# Patient Record
Sex: Female | Born: 2000 | Race: White | Hispanic: No | Smoking: Never smoker
Health system: Southern US, Community
[De-identification: ages and names within clinical notes are randomized; demographics above are authoritative.]

## PROBLEM LIST (undated history)

## (undated) DIAGNOSIS — H5500 Unspecified nystagmus: Secondary | ICD-10-CM

## (undated) DIAGNOSIS — F909 Attention-deficit hyperactivity disorder, unspecified type: Secondary | ICD-10-CM

## (undated) DIAGNOSIS — J45909 Unspecified asthma, uncomplicated: Secondary | ICD-10-CM

---
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
## 2022-02-01 NOTE — L&D Delivery Note (Signed)
Delivery Note  Courtney Lang is a G1P1001 at [redacted]w[redacted]d, Patient's last menstrual period was 05/26/2021 (exact date)., notconsistent with [place] at [redacted]w[redacted]d. Estimated Date of Delivery: 02/21/22   First Stage: Labor onset: 1500 Augmentation: oxytocin and AROM Analgesia /Anesthesia intrapartum: Epidural AROM at 2221 for meconium stained fluid  GBS: positive IP Antibiotics: penicillin  x 2 doses   Second Stage: Complete dilation at 0247 Onset of pushing at 0255 FHR second stage 140 bpm with moderate, variable decels with pushing   Courtney Lang presented to L&D with painful regular contractions. She was initially expectantly managed.  AROM was performed for augmentation after no cervical change and low dose oxytocin was started. She progressed well to C/C/+3 with an urge to push.  She pushed effectively over approximately 20 minutes for a spontaneous vaginal birth.  Delivery of a viable baby girl on 02/26/2022 at 0314 by CNM Delivery of fetal head in OA position with restitution to ROT. No nuchal cord;  Anterior then posterior shoulders delivered easily with gentle downward traction. Baby placed on mom's chest, and attended to by baby RN Cord double clamped after cessation of pulsation, cut by father of baby  Cord blood sample collection: Not Indicated A POS  Third Stage: Oxytocin bolus started after delivery of infant for hemorrhage prophylaxis  Placenta delivered via Schultz mechanism intact with 3 VC @ 0323 -Trailing membranes removed gently with ring forceps.  Examination of the cervix showed retained membranes in cervical OS.  Ring forceps used to remove retained membranes. Placenta disposition: discarded  Uterine tone firm / bleeding moderate  Bilateral periurethral and 1st degree vaginal laceration identified  Anesthesia for repair: epidural Repair of bilateral periurethral with 3-0 vicryl SH with 3 interrupted sutures on right side and 1 interrupted suture on left side. 1st degree vaginal laceration  repaired with 2-0 vicryl CT in usual fashion. Est. Blood Loss (mL): 150 ml  Complications: retained membranes  Mom to postpartum.  Baby to Couplet care / Skin to Skin.  Newborn: Information for the patient's newborn:  Courtney Lang, Girl Courtney Lang [[id number]]  Live born female "Courtney Lang" Birth Weight:  pending  APGAR: 8, 9   Newborn Delivery   Birth date/time: 02/26/2022 03:14:00 Delivery type: Vaginal, Spontaneous       Feeding planned: formula feeding  ---------- Tashay Bozich, CNM Certified Nurse Midwife [facility]   

## 2022-02-09 LAB — OB RESULTS CONSOLE GC/CHLAMYDIA
Chlamydia: POSITIVE
Neisseria Gonorrhea: NEGATIVE

## 2022-02-09 LAB — OB RESULTS CONSOLE HIV ANTIBODY (ROUTINE TESTING): HIV: NONREACTIVE

## 2022-02-09 LAB — OB RESULTS CONSOLE RPR: RPR: NONREACTIVE

## 2022-02-23 ENCOUNTER — Encounter: Payer: Self-pay | Admitting: Obstetrics and Gynecology

## 2022-02-23 ENCOUNTER — Other Ambulatory Visit: Payer: Self-pay

## 2022-02-23 ENCOUNTER — Observation Stay
Admission: EM | Admit: 2022-02-23 | Discharge: 2022-02-23 | Disposition: A | Discharge status: Home / Self Care | Payer: Medicaid Other | Attending: Obstetrics and Gynecology | Admitting: Obstetrics and Gynecology

## 2022-02-23 DIAGNOSIS — O99519 Diseases of the respiratory system complicating pregnancy, unspecified trimester: Secondary | ICD-10-CM | POA: Insufficient documentation

## 2022-02-23 DIAGNOSIS — O99891 Other specified diseases and conditions complicating pregnancy: Secondary | ICD-10-CM | POA: Diagnosis not present

## 2022-02-23 DIAGNOSIS — N898 Other specified noninflammatory disorders of vagina: Secondary | ICD-10-CM | POA: Diagnosis present

## 2022-02-23 DIAGNOSIS — Z3A00 Weeks of gestation of pregnancy not specified: Secondary | ICD-10-CM | POA: Diagnosis not present

## 2022-02-23 DIAGNOSIS — J45909 Unspecified asthma, uncomplicated: Secondary | ICD-10-CM | POA: Diagnosis not present

## 2022-02-23 DIAGNOSIS — O23593 Infection of other part of genital tract in pregnancy, third trimester: Principal | ICD-10-CM | POA: Insufficient documentation

## 2022-02-23 LAB — RUPTURE OF MEMBRANE (ROM)PLUS: Rom Plus: NEGATIVE

## 2022-02-23 MED ORDER — ZOLPIDEM TARTRATE 5 MG PO TABS: 5.0000 mg | ORAL_TABLET | Freq: Every evening | ORAL | Status: DC | PRN

## 2022-02-23 MED ORDER — DOCUSATE SODIUM 100 MG PO CAPS: 100.0000 mg | ORAL_CAPSULE | Freq: Every day | ORAL | Status: DC

## 2022-02-23 MED ORDER — PRENATAL MULTIVITAMIN CH: 1.0000 | ORAL_TABLET | Freq: Every day | ORAL | Status: DC

## 2022-02-23 MED ORDER — CALCIUM CARBONATE ANTACID 500 MG PO CHEW: 2.0000 | CHEWABLE_TABLET | ORAL | Status: DC | PRN

## 2022-02-23 MED ORDER — LACTATED RINGERS IV SOLN: 125.0000 mL/h | INTRAVENOUS | Status: DC

## 2022-02-23 MED ORDER — ACETAMINOPHEN 325 MG PO TABS: 650.0000 mg | ORAL_TABLET | ORAL | Status: DC | PRN

## 2022-02-23 NOTE — Progress Notes (Signed)
Pt discharged home per order.  Pt stable and ambulatory and an After Visit Summary was printed and given to the patient. Discharge education completed with patient/family including follow up instructions, appointments, and medication list. Pt received labor and bleeding precautions. Patient able to verbalize understanding, all questions fully answered upon discharge.  

## 2022-02-23 NOTE — Progress Notes (Signed)
Pt presents to L/D triage with reported leaking of fluid that began this morning at 0200. Pt reports no bleeding and positive fetal movement. Pt has not had to wear a pad. Pt reports positive fetal movement and no bleeding. Monitors applied and assessing. VSS.  ROM + collected.

## 2022-02-23 NOTE — Discharge Summary (Signed)
Courtney Lang is a 22 y.o. female. She is at [redacted]w[redacted]d gestation. Patient's last menstrual period was 05/22/2021. Estimated Date of Delivery: 02/21/22  Prenatal care site: [facility]   Current pregnancy complicated by:  - Nystagmus - Varicella equivalent - S<D (growth [place] normal) - Anemia - Chlamydia positive (TOC negative) - GBS positive  Chief complaint: She reports she experienced some leaking fluid this morning but none since. Denies contractions. Denies vaginal bleeding. Good fetal movement.  S: Resting comfortably. no CTX, no VB.no LOF,  Active fetal movement. Denies: HA, visual changes, SOB, or RUQ/epigastric pain  Maternal Medical History:   Past Medical History:  Diagnosis Date   ADHD (attention deficit hyperactivity disorder)    Asthma    prn inhaler   Nystagmus 07/2013    Past Surgical History:  Procedure Laterality Date   EYE SURGERY Bilateral 2016   STRABISMUS SURGERY Bilateral 07/20/2013   Procedure: REPAIR STRABISMUS PEDIATRIC BILATERAL;  Surgeon: Derry Skill, MD;  Location: [place];  Service: Ophthalmology;  Laterality: Bilateral;    No Known Allergies  Prior to Admission medications   Medication Sig Start Date End Date Taking? Authorizing Provider  Prenatal Vit-Fe Fumarate-FA (PRENATAL MULTIVITAMIN) TABS tablet Take 1 tablet by mouth daily at 12 noon.   Yes [provider]  albuterol (PROVENTIL HFA;VENTOLIN HFA) 108 (90 BASE) MCG/ACT inhaler Inhale 2 puffs into the lungs every 6 (six) hours as needed for wheezing or shortness of breath. Patient not taking: Reported on 02/23/2022    [provider]  baclofen (LIORESAL) 10 MG tablet Take 1 tablet (10 mg total) by mouth 3 (three) times daily. Patient not taking: Reported on 02/23/2022 05/08/21   Margarette Canada, NP  ibuprofen (ADVIL) 800 MG tablet Take 1 tablet (800 mg total) by mouth every 8 (eight) hours as needed. Patient not taking: Reported on 02/23/2022 05/05/21   Milton Ferguson, MD  mupirocin ointment (BACTROBAN) 2 % Apply 1 application topically 3 (three) times daily. Patient not taking: Reported on 05/05/2021 07/18/17   Lorin Picket, PA-C  nitrofurantoin, macrocrystal-monohydrate, (MACROBID) 100 MG capsule Take 1 capsule (100 mg total) by mouth 2 (two) times daily. Patient not taking: Reported on 05/05/2021 12/04/19   Faustino Congress, NP  predniSONE (STERAPRED UNI-PAK 21 TAB) 10 MG (21) TBPK tablet Take 6 tablets on day 1, 5 tablets day 2, 4 tablets day 3, 3 tablets day 4, 2 tablets day 5, 1 tablet day 6 Patient not taking: Reported on 02/23/2022 05/08/21   Margarette Canada, NP      Social History: She  reports that she has never smoked. She has been exposed to tobacco smoke. She has never used smokeless tobacco. She reports that she does not drink alcohol and does not use drugs.  Family History: family history includes COPD in her paternal grandfather; Heart disease in her maternal grandfather; Hypertension in her mother; Kidney disease in her maternal uncle.  no history of gyn cancers  Review of Systems: A full review of systems was performed and negative except as noted in the HPI.     O:  BP 110/61 (BP Location: Right Arm)   Pulse 64   Temp 98.7 F (37.1 C) (Oral)   Resp 18   Ht 5\' 3"  (1.6 m)   Wt 79.4 kg   LMP 05/22/2021   BMI 31.00 kg/m  Results for orders placed or performed during the hospital encounter of 02/23/22 (from the past 48 hour(s))  ROM Plus ([place] only)  Collection Time: 02/23/22 10:39 AM  Result Value Ref Range   Rom Plus NEGATIVE      Constitutional: NAD, AAOx3  HE/ENT: extraocular movements grossly intact, moist mucous membranes CV: RRR PULM: nl respiratory effort, CTABL     Abd: gravid, non-tender, non-distended, soft      Ext: Non-tender, Nonedematous   Psych: mood appropriate, speech normal Pelvic: 2/50/-2, BOW palpated  Pelvic exam: normal external genitalia, vulva, vagina, cervix, uterus and adnexa.  Fetal   monitoring: Cat 1 Appropriate for GA Baseline: 130bpm Variability: moderate Accelerations: present x >2 Decelerations absent Time 39mins Contractions: one  A/P: 22 y.o. [redacted]w[redacted]d here for antenatal surveillance for vaginal discharge  Principle Diagnosis:  Vaginal discharge in 3rd trimester  Labor: not present. ROM Plus negative. Fetal Wellbeing: Reassuring Cat 1 tracing. Reactive NST  IOL scheduled for 02/28/22 @ 0500 D/c home stable, precautions reviewed, follow-up as scheduled.    Gertie Fey, CNM 02/23/2022 12:23 PM

## 2022-02-25 ENCOUNTER — Inpatient Hospital Stay: Payer: Medicaid Other | Admitting: Anesthesiology

## 2022-02-25 ENCOUNTER — Inpatient Hospital Stay
Admission: EM | Admit: 2022-02-25 | Discharge: 2022-02-27 | DRG: 807 | Disposition: A | Discharge status: Home / Self Care | Payer: Medicaid Other | Attending: Obstetrics | Admitting: Obstetrics

## 2022-02-25 ENCOUNTER — Encounter: Payer: Self-pay | Admitting: Obstetrics and Gynecology

## 2022-02-25 ENCOUNTER — Other Ambulatory Visit: Payer: Self-pay

## 2022-02-25 DIAGNOSIS — O9902 Anemia complicating childbirth: Secondary | ICD-10-CM | POA: Diagnosis present

## 2022-02-25 DIAGNOSIS — O26893 Other specified pregnancy related conditions, third trimester: Secondary | ICD-10-CM | POA: Diagnosis present

## 2022-02-25 DIAGNOSIS — D509 Iron deficiency anemia, unspecified: Secondary | ICD-10-CM | POA: Diagnosis present

## 2022-02-25 DIAGNOSIS — Z3A40 40 weeks gestation of pregnancy: Secondary | ICD-10-CM

## 2022-02-25 DIAGNOSIS — O480 Post-term pregnancy: Secondary | ICD-10-CM | POA: Diagnosis present

## 2022-02-25 DIAGNOSIS — O99824 Streptococcus B carrier state complicating childbirth: Secondary | ICD-10-CM | POA: Diagnosis present

## 2022-02-25 DIAGNOSIS — Z23 Encounter for immunization: Secondary | ICD-10-CM

## 2022-02-25 DIAGNOSIS — O4290 Premature rupture of membranes, unspecified as to length of time between rupture and onset of labor, unspecified weeks of gestation: Principal | ICD-10-CM | POA: Diagnosis present

## 2022-02-25 LAB — CBC
HCT: 31.8 % — ABNORMAL LOW (ref 36.0–46.0)
Hemoglobin: 11 g/dL — ABNORMAL LOW (ref 12.0–15.0)
MCH: 31.3 pg (ref 26.0–34.0)
MCHC: 34.6 g/dL (ref 30.0–36.0)
MCV: 90.3 fL (ref 80.0–100.0)
Platelets: 323 10*3/uL (ref 150–400)
RBC: 3.52 MIL/uL — ABNORMAL LOW (ref 3.87–5.11)
RDW: 12.3 % (ref 11.5–15.5)
WBC: 15.8 10*3/uL — ABNORMAL HIGH (ref 4.0–10.5)
nRBC: 0 % (ref 0.0–0.2)

## 2022-02-25 LAB — ABO/RH: ABO/RH(D): A POS

## 2022-02-25 LAB — TYPE AND SCREEN
ABO/RH(D): A POS
Antibody Screen: NEGATIVE

## 2022-02-25 MED ORDER — EPHEDRINE 5 MG/ML INJ: 10.0000 mg | INTRAVENOUS | Status: DC | PRN

## 2022-02-25 MED ORDER — TERBUTALINE SULFATE 1 MG/ML IJ SOLN: 0.2500 mg | Freq: Once | INTRAMUSCULAR | Status: DC | PRN

## 2022-02-25 MED ORDER — PENICILLIN G POT IN DEXTROSE 60000 UNIT/ML IV SOLN
3.0000 10*6.[IU] | INTRAVENOUS | Status: DC
  Administered 2022-02-26: 3 10*6.[IU] via INTRAVENOUS
  Filled 2022-02-25: qty 50

## 2022-02-25 MED ORDER — LACTATED RINGERS IV SOLN
500.0000 mL | INTRAVENOUS | Status: DC | PRN
  Administered 2022-02-25: 500 mL via INTRAVENOUS

## 2022-02-25 MED ORDER — LACTATED RINGERS IV SOLN
500.0000 mL | Freq: Once | INTRAVENOUS | Status: AC
  Administered 2022-02-25: 500 mL via INTRAVENOUS

## 2022-02-25 MED ORDER — ONDANSETRON HCL 4 MG/2ML IJ SOLN: 4.0000 mg | Freq: Four times a day (QID) | INTRAMUSCULAR | Status: DC | PRN

## 2022-02-25 MED ORDER — LIDOCAINE-EPINEPHRINE (PF) 1.5 %-1:200000 IJ SOLN
INTRAMUSCULAR | Status: DC | PRN
  Administered 2022-02-25: 3 mL via EPIDURAL

## 2022-02-25 MED ORDER — FENTANYL CITRATE (PF) 100 MCG/2ML IJ SOLN: 50.0000 ug | INTRAMUSCULAR | Status: DC | PRN

## 2022-02-25 MED ORDER — SOD CITRATE-CITRIC ACID 500-334 MG/5ML PO SOLN: 30.0000 mL | ORAL | Status: DC | PRN

## 2022-02-25 MED ORDER — ACETAMINOPHEN 500 MG PO TABS: 1000.0000 mg | ORAL_TABLET | Freq: Four times a day (QID) | ORAL | Status: DC | PRN

## 2022-02-25 MED ORDER — PHENYLEPHRINE 80 MCG/ML (10ML) SYRINGE FOR IV PUSH (FOR BLOOD PRESSURE SUPPORT): 80.0000 ug | PREFILLED_SYRINGE | INTRAVENOUS | Status: DC | PRN

## 2022-02-25 MED ORDER — LACTATED RINGERS IV SOLN
INTRAVENOUS | Status: DC
  Administered 2022-02-25: 125 mL via INTRAVENOUS

## 2022-02-25 MED ORDER — FENTANYL-BUPIVACAINE-NACL 0.5-0.125-0.9 MG/250ML-% EP SOLN
EPIDURAL | Status: AC
  Filled 2022-02-25: qty 250

## 2022-02-25 MED ORDER — FENTANYL-BUPIVACAINE-NACL 0.5-0.125-0.9 MG/250ML-% EP SOLN
12.0000 mL/h | EPIDURAL | Status: DC | PRN
  Administered 2022-02-25: 12 mL/h via EPIDURAL

## 2022-02-25 MED ORDER — LIDOCAINE HCL (PF) 1 % IJ SOLN: 30.0000 mL | INTRAMUSCULAR | Status: DC | PRN

## 2022-02-25 MED ORDER — OXYTOCIN-SODIUM CHLORIDE 30-0.9 UT/500ML-% IV SOLN: 2.5000 [IU]/h | INTRAVENOUS | Status: DC

## 2022-02-25 MED ORDER — OXYTOCIN BOLUS FROM INFUSION: 333.0000 mL | Freq: Once | INTRAVENOUS | Status: DC

## 2022-02-25 MED ORDER — SODIUM CHLORIDE 0.9 % IV SOLN
5.0000 10*6.[IU] | Freq: Once | INTRAVENOUS | Status: AC
  Administered 2022-02-25: 5 10*6.[IU] via INTRAVENOUS
  Filled 2022-02-25: qty 5

## 2022-02-25 MED ORDER — SODIUM CHLORIDE 0.9% FLUSH: 3.0000 mL | INTRAVENOUS | Status: DC | PRN

## 2022-02-25 MED ORDER — DIPHENHYDRAMINE HCL 50 MG/ML IJ SOLN: 12.5000 mg | INTRAMUSCULAR | Status: DC | PRN

## 2022-02-25 MED ORDER — CALCIUM CARBONATE ANTACID 500 MG PO CHEW: 2.0000 | CHEWABLE_TABLET | ORAL | Status: DC | PRN

## 2022-02-25 MED ORDER — SODIUM CHLORIDE 0.9% FLUSH: 3.0000 mL | Freq: Two times a day (BID) | INTRAVENOUS | Status: DC

## 2022-02-25 MED ORDER — SODIUM CHLORIDE 0.9 % IV SOLN: 250.0000 mL | INTRAVENOUS | Status: DC | PRN

## 2022-02-25 MED ORDER — BUPIVACAINE HCL (PF) 0.25 % IJ SOLN
INTRAMUSCULAR | Status: DC | PRN
  Administered 2022-02-25: 8 mL via EPIDURAL

## 2022-02-25 MED ORDER — OXYTOCIN-SODIUM CHLORIDE 30-0.9 UT/500ML-% IV SOLN
1.0000 m[IU]/min | INTRAVENOUS | Status: DC
  Administered 2022-02-25: 2 m[IU]/min via INTRAVENOUS
  Filled 2022-02-25: qty 500

## 2022-02-25 NOTE — H&P (Signed)
OB History & Physical   History of Present Illness:   Chief Complaint: contractions   HPI:  Courtney Lang is a 22 y.o. G1P0 female at [redacted]w[redacted]d, Patient's last menstrual period was 05/26/2021 (exact date)., not consistent with [place] at [redacted]w[redacted]d, with Estimated Date of Delivery: 02/21/22.  She presents to L&D for painful contractions that have gotten progressively worse.  Contractions started around 1500 today.  She felt some LOF around 1700 but has not had any continued leaking since.  Denies vaginal bleeding.  Endorses good fetal movement.    Reports active fetal movement  Contractions: every 3 to 4 minutes, started at 1500 LOF/SROM: possible gush of fluid at 1700 Vaginal bleeding: denies   Factors complicating pregnancy:  Uterine Size < dates - EFW 01/21/22 42% Varicella equivocal  GBS positive  Chlamydia in pregnancy - TOC neg 02/22/2022 Maternal iron-deficiency anemia in pregnancy   Patient Active Problem List   Diagnosis Date Noted   Leakage of amniotic fluid 02/25/2022   Indication for care in labor or delivery 02/25/2022   Vaginal discharge during pregnancy in third trimester 02/23/2022    Prenatal Transfer Tool  Maternal Diabetes: No Genetic Screening: Normal Maternal Ultrasounds/Referrals: Normal Fetal Ultrasounds or other Referrals:  None Maternal Substance Abuse:  No Significant Maternal Medications:  None Significant Maternal Lab Results: Group B Strep positive and Other: Chlamydia pos 02/09/2022, TOC neg 02/22/2022  Maternal Medical History:   Past Medical History:  Diagnosis Date   ADHD (attention deficit hyperactivity disorder)    Asthma    prn inhaler   Nystagmus 07/2013    Past Surgical History:  Procedure Laterality Date   EYE SURGERY Bilateral 2016   STRABISMUS SURGERY Bilateral 07/20/2013   Procedure: REPAIR STRABISMUS PEDIATRIC BILATERAL;  Surgeon: Derry Skill, MD;  Location: [place];  Service: Ophthalmology;  Laterality: Bilateral;    No  Known Allergies  Prior to Admission medications   Medication Sig Start Date End Date Taking? Authorizing Provider  Prenatal Vit-Fe Fumarate-FA (PRENATAL MULTIVITAMIN) TABS tablet Take 1 tablet by mouth daily at 12 noon.   Yes [provider]  albuterol (PROVENTIL HFA;VENTOLIN HFA) 108 (90 BASE) MCG/ACT inhaler Inhale 2 puffs into the lungs every 6 (six) hours as needed for wheezing or shortness of breath. Patient not taking: Reported on 02/23/2022    [provider]  baclofen (LIORESAL) 10 MG tablet Take 1 tablet (10 mg total) by mouth 3 (three) times daily. Patient not taking: Reported on 02/23/2022 05/08/21   Margarette Canada, NP  ibuprofen (ADVIL) 800 MG tablet Take 1 tablet (800 mg total) by mouth every 8 (eight) hours as needed. Patient not taking: Reported on 02/23/2022 05/05/21   Milton Ferguson, MD  mupirocin ointment (BACTROBAN) 2 % Apply 1 application topically 3 (three) times daily. Patient not taking: Reported on 05/05/2021 07/18/17   Lorin Picket, PA-C  nitrofurantoin, macrocrystal-monohydrate, (MACROBID) 100 MG capsule Take 1 capsule (100 mg total) by mouth 2 (two) times daily. Patient not taking: Reported on 05/05/2021 12/04/19   Faustino Congress, NP  predniSONE (STERAPRED UNI-PAK 21 TAB) 10 MG (21) TBPK tablet Take 6 tablets on day 1, 5 tablets day 2, 4 tablets day 3, 3 tablets day 4, 2 tablets day 5, 1 tablet day 6 Patient not taking: Reported on 02/23/2022 05/08/21   Margarette Canada, NP     [facility] care [facility]  OB History  Gravida Para Term Preterm AB Living  1 0 0 0 0 0  SAB IAB Ectopic Multiple Live Births  0 0 0 0 0    # Outcome Date GA Lbr Len/2nd Weight Sex Delivery Anes PTL Lv  1 Current              Social History: She  reports that she has never smoked. She has been exposed to tobacco smoke. She has never used smokeless tobacco. She reports that she does not drink alcohol and does not use drugs.  Family History: family history  includes COPD in her paternal grandfather; Heart disease in her maternal grandfather; Hypertension in her mother; Kidney disease in her maternal uncle.   Review of Systems: A full review of systems was performed and negative except as noted in the HPI.     Physical Exam:  Vital Signs: LMP 05/26/2021 (Exact Date)   General: no acute distress.  HEENT: normocephalic, atraumatic Heart: regular rate & rhythm Lungs: normal respiratory effort Abdomen: soft, gravid, non-tender;  EFW: 7 lbs  Pelvic:   External: Normal external female genitalia  Cervix: Dilation: 4 / Effacement (%): 90 / Station: -1    Extremities: non-tender, symmetric, no edema bilaterally.  DTRs: 2+/2+  Neurologic: Alert & oriented x 3.    No results found for this or any previous visit (from the past 24 hour(s)).  Pertinent Results:  Prenatal Labs: Blood type/Rh A pos  Antibody screen Negative    Rubella Immune    Varicella Equivocal   RPR NR    HBsAg NR   Hep C NR   HIV NR    GC neg  Chlamydia 02/09/2022 - POS, 02/22/2022 TOC NEG  Genetic screening cfDNA negative/AFP neg  1 hour GTT 101  3 hour GTT N/A  GBS Pos     FHT:  FHR: 120 bpm, variability: moderate,  accelerations:  Present,  decelerations:  Absent Category/reactivity:  Category I UC:   regular, every 3-4 minutes   Cephalic by [place] and SVE   No results found.  Assessment:  Courtney Lang is a 22 y.o. G1P0 female at [redacted]w[redacted]d with Latent labor.   Plan:  1. Admit to Labor & Delivery - consents reviewed and obtained - Dr. Ouida Sills notified of admission and plan of care   2. Fetal Well being  - Fetal Tracing: cat 1 - Group B Streptococcus ppx indicated: GBS positive - Presentation: cephalic confirmed by SVE   3. Routine OB: - Prenatal labs reviewed, as above - Rh positive - CBC, T&S, RPR on admit - Clear liquid diet , continuous IV fluids  4. Monitoring of labor  - Contractions monitored with external toco - Pelvis adequate for trial of  labor  - Plan for expectant management  - Augmentation with oxytocin and AROM as appropriate  - Plan for  continuous fetal monitoring - Maternal pain control as desired; planning regional anesthesia - Anticipate vaginal delivery  5. Post Partum Planning: - Infant feeding: formula feeding - Contraception:  Nexplanon - Flu vaccine:  declined  - Tdap vaccine: Given prenatally - RSV vaccine: Given prenatally  Minda Meo, CNM 02/25/22 8:16 PM  Drinda Butts, [place] Certified Nurse Midwife [place]

## 2022-02-25 NOTE — Anesthesia Preprocedure Evaluation (Addendum)
Anesthesia Evaluation  Patient identified by MRN, date of birth, ID band Patient awake    Reviewed: Allergy & Precautions, NPO status , Patient's Chart, lab work & pertinent test results  History of Anesthesia Complications Negative for: history of anesthetic complications  Airway Mallampati: III   Neck ROM: Full    Dental   Pulmonary neg pulmonary ROS   Pulmonary exam normal breath sounds clear to auscultation       Cardiovascular Exercise Tolerance: Good negative cardio ROS Normal cardiovascular exam Rhythm:Regular Rate:Normal     Neuro/Psych  PSYCHIATRIC DISORDERS (ADHD)      Nystagmus s/p strabismus repair 2015    GI/Hepatic negative GI ROS,,,  Endo/Other  negative endocrine ROS    Renal/GU negative Renal ROS     Musculoskeletal   Abdominal   Peds  Hematology  (+) Blood dyscrasia, anemia   Anesthesia Other Findings   Reproductive/Obstetrics                             Anesthesia Physical Anesthesia Plan  ASA: 2  Anesthesia Plan: Epidural   Post-op Pain Management:    Induction:   PONV Risk Score and Plan: 2 and Treatment may vary due to age or medical condition  Airway Management Planned: Natural Airway  Additional Equipment:   Intra-op Plan:   Post-operative Plan:   Informed Consent: I have reviewed the patients History and Physical, chart, labs and discussed the procedure including the risks, benefits and alternatives for the proposed anesthesia with the patient or authorized representative who has indicated his/her understanding and acceptance.     Dental Advisory Given  Plan Discussed with:   Anesthesia Plan Comments: (Patient reports no bleeding problems and no anticoagulant use.   Patient consented for risks of anesthesia including but not limited to:  - adverse reactions to medications - risk of bleeding, infection and or nerve damage from epidural that  could lead to paralysis - risk of headache or failed epidural - nerve damage due to positioning - that if epidural is used for C-section that there is a chance of epidural failure requiring spinal placement or conversion to GA - Damage to heart, brain, lungs, other parts of body or loss of life  Patient voiced understanding.)       Anesthesia Quick Evaluation

## 2022-02-25 NOTE — Anesthesia Procedure Notes (Signed)
Epidural Patient location during procedure: OB Start time: 02/25/2022 8:52 PM End time: 02/25/2022 9:03 PM  Staffing Anesthesiologist: Darrin Nipper, MD Performed: anesthesiologist   Preanesthetic Checklist Completed: patient identified, IV checked, risks and benefits discussed, surgical consent, monitors and equipment checked, pre-op evaluation and timeout performed  Epidural Patient position: sitting Prep: Betadine Patient monitoring: heart rate, continuous pulse ox and blood pressure Approach: midline Location: L3-L4 Injection technique: LOR air  Needle:  Needle type: Tuohy  Needle gauge: 17 G Needle length: 9 cm Needle insertion depth: 7 cm Catheter at skin depth: 12 cm Test dose: negative and 1.5% lidocaine with Epi 1:200 K  Assessment Sensory level: T4  Additional Notes Straightforward placement without apparent complications.Reason for block:procedure for pain

## 2022-02-25 NOTE — OB Triage Note (Signed)
Patient states she started contractions started around 1500 about every 5 minutes rating 8.5/10 pain scale in abdomen and back. Patient states she thinks her water broke around 1750 clear fluid. Pt denies vaginal bleeding, or any other concerns. Baby is moving well and patient is tolerating regular diet. Monitors applied and assessing

## 2022-02-26 ENCOUNTER — Encounter: Payer: Self-pay | Admitting: Obstetrics and Gynecology

## 2022-02-26 LAB — CBC
HCT: 30.4 % — ABNORMAL LOW (ref 36.0–46.0)
Hemoglobin: 10.3 g/dL — ABNORMAL LOW (ref 12.0–15.0)
MCH: 31.1 pg (ref 26.0–34.0)
MCHC: 33.9 g/dL (ref 30.0–36.0)
MCV: 91.8 fL (ref 80.0–100.0)
Platelets: 331 10*3/uL (ref 150–400)
RBC: 3.31 MIL/uL — ABNORMAL LOW (ref 3.87–5.11)
RDW: 12.3 % (ref 11.5–15.5)
WBC: 17.7 10*3/uL — ABNORMAL HIGH (ref 4.0–10.5)
nRBC: 0 % (ref 0.0–0.2)

## 2022-02-26 LAB — RPR: RPR Ser Ql: NONREACTIVE

## 2022-02-26 MED ORDER — DIBUCAINE (PERIANAL) 1 % EX OINT: 1.0000 | TOPICAL_OINTMENT | CUTANEOUS | Status: DC | PRN

## 2022-02-26 MED ORDER — FERROUS SULFATE 325 (65 FE) MG PO TABS
325.0000 mg | ORAL_TABLET | Freq: Two times a day (BID) | ORAL | Status: DC
  Administered 2022-02-26 – 2022-02-27 (×3): 325 mg via ORAL
  Filled 2022-02-26 (×3): qty 1

## 2022-02-26 MED ORDER — ONDANSETRON HCL 4 MG/2ML IJ SOLN: 4.0000 mg | INTRAMUSCULAR | Status: DC | PRN

## 2022-02-26 MED ORDER — IBUPROFEN 600 MG PO TABS
600.0000 mg | ORAL_TABLET | Freq: Four times a day (QID) | ORAL | Status: DC
  Administered 2022-02-26 – 2022-02-27 (×4): 600 mg via ORAL
  Filled 2022-02-26 (×4): qty 1

## 2022-02-26 MED ORDER — ONDANSETRON HCL 4 MG PO TABS: 4.0000 mg | ORAL_TABLET | ORAL | Status: DC | PRN

## 2022-02-26 MED ORDER — WITCH HAZEL-GLYCERIN EX PADS
1.0000 | MEDICATED_PAD | CUTANEOUS | Status: DC | PRN
  Administered 2022-02-26: 1 via TOPICAL
  Filled 2022-02-26: qty 100

## 2022-02-26 MED ORDER — PRENATAL MULTIVITAMIN CH
1.0000 | ORAL_TABLET | Freq: Every day | ORAL | Status: DC
  Administered 2022-02-26 – 2022-02-27 (×2): 1 via ORAL
  Filled 2022-02-26 (×2): qty 1

## 2022-02-26 MED ORDER — SODIUM CHLORIDE 0.9% FLUSH: 3.0000 mL | Freq: Two times a day (BID) | INTRAVENOUS | Status: DC

## 2022-02-26 MED ORDER — SIMETHICONE 80 MG PO CHEW: 80.0000 mg | CHEWABLE_TABLET | ORAL | Status: DC | PRN

## 2022-02-26 MED ORDER — SENNOSIDES-DOCUSATE SODIUM 8.6-50 MG PO TABS
2.0000 | ORAL_TABLET | Freq: Every day | ORAL | Status: DC
  Administered 2022-02-27: 2 via ORAL
  Filled 2022-02-26: qty 2

## 2022-02-26 MED ORDER — SODIUM CHLORIDE 0.9% FLUSH: 3.0000 mL | INTRAVENOUS | Status: DC | PRN

## 2022-02-26 MED ORDER — ACETAMINOPHEN 500 MG PO TABS
1000.0000 mg | ORAL_TABLET | Freq: Four times a day (QID) | ORAL | Status: DC
  Administered 2022-02-26 – 2022-02-27 (×4): 1000 mg via ORAL
  Filled 2022-02-26 (×4): qty 2

## 2022-02-26 MED ORDER — DIPHENHYDRAMINE HCL 25 MG PO CAPS: 25.0000 mg | ORAL_CAPSULE | Freq: Four times a day (QID) | ORAL | Status: DC | PRN

## 2022-02-26 MED ORDER — BENZOCAINE-MENTHOL 20-0.5 % EX AERO
1.0000 | INHALATION_SPRAY | CUTANEOUS | Status: DC | PRN
  Administered 2022-02-26: 1 via TOPICAL
  Filled 2022-02-26: qty 56

## 2022-02-26 MED ORDER — VARICELLA VIRUS VACCINE LIVE 1350 PFU/0.5ML IJ SUSR
0.5000 mL | INTRAMUSCULAR | Status: AC | PRN
  Administered 2022-02-27: 0.5 mL via SUBCUTANEOUS
  Filled 2022-02-26: qty 0.5

## 2022-02-26 MED ORDER — COCONUT OIL OIL: 1.0000 | TOPICAL_OIL | Status: DC | PRN

## 2022-02-26 MED ORDER — ZOLPIDEM TARTRATE 5 MG PO TABS: 5.0000 mg | ORAL_TABLET | Freq: Every evening | ORAL | Status: DC | PRN

## 2022-02-26 MED ORDER — SODIUM CHLORIDE 0.9 % IV SOLN: 250.0000 mL | INTRAVENOUS | Status: DC | PRN

## 2022-02-26 NOTE — Progress Notes (Deleted)
Delivery Note  Courtney Lang is a G1P1001 at [redacted]w[redacted]d, Patient's last menstrual period was 05/26/2021 (exact date)., notconsistent with [place] at [redacted]w[redacted]d. Estimated Date of Delivery: 02/21/22   First Stage: Labor onset: 1500 Augmentation: oxytocin and AROM Analgesia /Anesthesia intrapartum: Epidural AROM at 2221 for meconium stained fluid  GBS: positive IP Antibiotics: penicillin  x 2 doses   Second Stage: Complete dilation at 0247 Onset of pushing at 0255 FHR second stage 140 bpm with moderate, variable decels with pushing   [name] presented to L&D with painful regular contractions. She was initially expectantly managed.  AROM was performed for augmentation after no cervical change and low dose oxytocin was started. She progressed well to C/C/+3 with an urge to push.  She pushed effectively over approximately 20 minutes for a spontaneous vaginal birth.  Delivery of a viable baby girl on 02/26/2022 at 0314 by CNM Delivery of fetal head in OA position with restitution to ROT. No nuchal cord;  Anterior then posterior shoulders delivered easily with gentle downward traction. Baby placed on mom's chest, and attended to by baby RN Cord double clamped after cessation of pulsation, cut by father of baby  Cord blood sample collection: Not Indicated A POS  Third Stage: Oxytocin bolus started after delivery of infant for hemorrhage prophylaxis  Placenta delivered via Delena Bali mechanism intact with 3 VC @ 0323 -Trailing membranes removed gently with ring forceps.  Examination of the cervix showed retained membranes in cervical OS.  Ring forceps used to remove retained membranes. Placenta disposition: discarded  Uterine tone firm / bleeding moderate  Bilateral periurethral and 1st degree vaginal laceration identified  Anesthesia for repair: epidural Repair of bilateral periurethral with 3-0 vicryl SH with 3 interrupted sutures on right side and 1 interrupted suture on left side. 1st degree vaginal laceration  repaired with 2-0 vicryl CT in usual fashion. Est. Blood Loss (mL): 497 ml  Complications: retained membranes  Mom to postpartum.  Baby to Couplet care / Skin to Skin.  Newborn: Information for the patient's newborn:  [name], [name] [[id number]]  Live born female "[place]" Birth Weight:  pending  APGAR: 8, 9   Newborn Delivery   Birth date/time: 02/26/2022 03:14:00 Delivery type: Vaginal, Spontaneous       Feeding planned: formula feeding  ---------- Drinda Butts, CNM Certified Nurse Midwife [facility]

## 2022-02-26 NOTE — Progress Notes (Signed)
L&D Note    Subjective:  Feeling better after epidural  Objective:   Vitals:   02/26/22 0110 02/26/22 0125 02/26/22 0140 02/26/22 0225  BP: 118/64 114/65 106/61 (!) 117/55  Pulse: 77 81 73 64  Resp:      Temp:      TempSrc:      SpO2: 97% 98% 98% 98%    Current Vital Signs 24h Vital Sign Ranges  T 98.1 F (36.7 C) Temp  Avg: 98.2 F (36.8 C)  Min: 98.1 F (36.7 C)  Max: 98.3 F (36.8 C)  BP (!) 117/55 BP  Min: 90/53  Max: 118/64  HR 64 Pulse  Avg: 68.5  Min: 52  Max: 87  RR 18 Resp  Avg: 18  Min: 18  Max: 18  SaO2 98 %   SpO2  Avg: 99 %  Min: 97 %  Max: 100 %      Gen: alert, cooperative, no distress FHR: Baseline: 120 bpm, Variability: moderate, Accels: Present, Decels: variable Toco: regular, every 3-5 minutes, IUPC placed  SVE: 4/90/-2  Medications SCHEDULED MEDICATIONS   oxytocin 40 units in LR 1000 mL  333 mL Intravenous Once   sodium chloride flush  3 mL Intravenous Q12H    MEDICATION INFUSIONS   sodium chloride     fentaNYL 2 mcg/mL w/bupivacaine 0.125% in NS 250 mL 12 mL/hr (02/25/22 2106)   lactated ringers 500 mL (02/25/22 2027)   lactated ringers 125 mL (02/25/22 2122)   oxytocin     oxytocin 4 milli-units/min (02/26/22 0120)   pencillin G potassium IV 3 Million Units (02/26/22 0228)    PRN MEDICATIONS  sodium chloride, acetaminophen, acetaminophen, calcium carbonate, diphenhydrAMINE, ePHEDrine, ePHEDrine, fentaNYL (SUBLIMAZE) injection, fentaNYL 2 mcg/mL w/bupivacaine 0.125% in NS 250 mL, lactated ringers, lidocaine (PF), ondansetron, phenylephrine, phenylephrine, sodium chloride flush, sodium citrate-citric acid, terbutaline   Assessment & Plan:  22 y.o. G1P1001 at [redacted]w[redacted]d admitted for latent labor  -Labor: Early latent labor. -Fetal Well-being: Category II - overall reassuring with moderate variability  -GBS: positive - PCN x 1  -Membranes artificially ruptured, meconium stained amniotic fluid at 2221 -Augmentation: IV Pitocin  augmentation. -Analgesia: regional anesthesia   Minda Meo, CNM  02/26/2022 3:51 AM  [facility]

## 2022-02-26 NOTE — Discharge Instructions (Signed)
Vaginal Delivery, Care After Refer to this sheet in the next few weeks. These discharge instructions provide you with information on caring for yourself after delivery. Your caregiver may also give you specific instructions. Your treatment has been planned according to the most current medical practices available, but problems sometimes occur. Call your caregiver if you have any problems or questions after you go home. HOME CARE INSTRUCTIONS Take over-the-counter or prescription medicines only as directed by your caregiver or pharmacist. Do not drink alcohol, especially if you are breastfeeding or taking medicine to relieve pain. Do not smoke tobacco. Continue to use good perineal care. Good perineal care includes: Wiping your perineum from back to front Keeping your perineum clean. You can do sitz baths twice a day, to help keep this area clean Do not use tampons, douche or have sex until your caregiver says it is okay. Shower only and avoid sitting in submerged water, aside from sitz baths Wear a well-fitting bra that provides breast support. Eat healthy foods. Drink enough fluids to keep your urine clear or pale yellow. Eat high-fiber foods such as whole grain cereals and breads, Troost rice, beans, and fresh fruits and vegetables every day. These foods may help prevent or relieve constipation. Avoid constipation with high fiber foods or medications, such as miralax or metamucil Follow your caregiver's recommendations regarding resumption of activities such as climbing stairs, driving, lifting, exercising, or traveling. Talk to your caregiver about resuming sexual activities. Resumption of sexual activities is dependent upon your risk of infection, your rate of healing, and your comfort and desire to resume sexual activity. Try to have someone help you with your household activities and your newborn for at least a few days after you leave the hospital. Rest as much as possible. Try to rest or  take a nap when your newborn is sleeping. Increase your activities gradually. Keep all of your scheduled postpartum appointments. It is very important to keep your scheduled follow-up appointments. At these appointments, your caregiver will be checking to make sure that you are healing physically and emotionally. SEEK MEDICAL CARE IF:  You are passing large clots from your vagina. Save any clots to show your caregiver. You have a foul smelling discharge from your vagina. You have trouble urinating. You are urinating frequently. You have pain when you urinate. You have a change in your bowel movements. You have increasing redness, pain, or swelling near your vaginal incision (episiotomy) or vaginal tear. You have pus draining from your episiotomy or vaginal tear. Your episiotomy or vaginal tear is separating. You have painful, hard, or reddened breasts. You have a severe headache. You have blurred vision or see spots. You feel sad or depressed. You have thoughts of hurting yourself or your newborn. You have questions about your care, the care of your newborn, or medicines. You are dizzy or light-headed. You have a rash. You have nausea or vomiting. You were breastfeeding and have not had a menstrual period within 12 weeks after you stopped breastfeeding. You are not breastfeeding and have not had a menstrual period by the 12th week after delivery. You have a fever. SEEK IMMEDIATE MEDICAL CARE IF:  You have persistent pain. You have chest pain. You have shortness of breath. You faint. You have leg pain. You have stomach pain. Your vaginal bleeding saturates two or more sanitary pads in 1 hour. MAKE SURE YOU:  Understand these instructions. Will watch your condition. Will get help right away if you are not doing well or   get worse. Document Released: 01/16/2000 Document Revised: 06/04/2013 Document Reviewed: 09/15/2011 ExitCare Patient Information 2015 [employer]. This  information is not intended to replace advice given to you by your health care provider. Make sure you discuss any questions you have with your health care provider.  Sitz Bath A sitz bath is a warm water bath taken in the sitting position. The water covers only the hips and butt (buttocks). We recommend using one that fits in the toilet, to help with ease of use and cleanliness. It may be used for either healing or cleaning purposes. Sitz baths are also used to relieve pain, itching, or muscle tightening (spasms). The water may contain medicine. Moist heat will help you heal and relax.  HOME CARE  Take 3 to 4 sitz baths a day. Fill the bathtub half-full with warm water. Sit in the water and open the drain a little. Turn on the warm water to keep the tub half-full. Keep the water running constantly. Soak in the water for 15 to 20 minutes. After the sitz bath, pat the affected area dry. GET HELP RIGHT AWAY IF: You get worse instead of better. Stop the sitz baths if you get worse. MAKE SURE YOU: Understand these instructions. Will watch your condition. Will get help right away if you are not doing well or get worse. Document Released: 02/26/2004 Document Revised: 10/13/2011 Document Reviewed: 05/18/2010 ExitCare Patient Information 2015 [employer]. This information is not intended to replace advice given to you by your health care provider. Make sure you discuss any questions you have with your health care provider.   

## 2022-02-26 NOTE — Discharge Summary (Signed)
Obstetrical Discharge Summary  Patient Name: Courtney Lang DOB: Jan 19, 2001 MRN: [id number]  Date of Admission: 02/25/2022 Date of Delivery: 02/26/2022 Delivered by: Drinda Butts, CNM  Date of Discharge: 02/27/2022  Primary OB: [facility] [id number] last menstrual period was 05/26/2021 (exact date). EDC Estimated Date of Delivery: 02/21/22 Gestational Age at Delivery: [redacted]w[redacted]d   Antepartum complications:  Uterine Size < dates - EFW 01/21/22 42% Varicella equivocal  GBS positive  Chlamydia in pregnancy - TOC neg 02/22/2022 Maternal iron-deficiency anemia in pregnancy   Admitting Diagnosis: Leakage of amniotic fluid [O42.90] Indication for care in labor or delivery [O75.9]  Secondary Diagnosis: Patient Active Problem List   Diagnosis Date Noted   Leakage of amniotic fluid 02/25/2022   Indication for care in labor or delivery 02/25/2022   Vaginal discharge during pregnancy in third trimester 02/23/2022    Discharge Diagnosis: Term Pregnancy Delivered      Augmentation: AROM and Pitocin Complications: retained membranes  Intrapartum complications/course: Lyndi presented to L&D with painful regular contractions. She was initially expectantly managed.  AROM was performed for augmentation after no cervical change and low dose oxytocin was started. She progressed well to C/C/+3 with an urge to push.  She pushed effectively over approximately 20 minutes for a spontaneous vaginal birth.  Delivery Type: spontaneous vaginal delivery Anesthesia: epidural anesthesia Placenta: spontaneous To Pathology: No  Laceration: 1st degree, vaginal, and bilateral periurethral Repair of bilateral periurethral with 3-0 vicryl SH with 3 interrupted sutures on right side and 1 interrupted suture on left side. 1st degree vaginal laceration repaired with 2-0 vicryl CT in usual fashion Episiotomy: none Newborn Data: Live born female "Gracelyn" Birth Weight:  6#15.5 APGAR: 8, 9   Newborn Delivery    Birth date/time: 02/26/2022 03:14:00 Delivery type: Vaginal, Spontaneous      Postpartum Procedures: none Edinburgh:     02/26/2022    5:06 PM  Edinburgh Postnatal Depression Scale Screening Tool  I have been able to laugh and see the funny side of things. 0  I have looked forward with enjoyment to things. 0  I have blamed myself unnecessarily when things went wrong. 1  I have been anxious or worried for no good reason. 0  I have felt scared or panicky for no good reason. 0  Things have been getting on top of me. 1  I have been so unhappy that I have had difficulty sleeping. 0  I have felt sad or miserable. 0  I have been so unhappy that I have been crying. 0  The thought of harming myself has occurred to me. 0  Edinburgh Postnatal Depression Scale Total 2     Post partum course:  Patient had an uncomplicated postpartum course.  By time of discharge on PPD#1, her pain was controlled on oral pain medications; she had appropriate lochia and was ambulating, voiding without difficulty and tolerating regular diet.  She was deemed stable for discharge to home.      Discharge Physical Exam:  BP 120/74 (BP Location: Right Arm)   Pulse (!) 53   Temp 97.7 F (36.5 C) (Oral)   Resp 18   LMP 05/26/2021 (Exact Date)   SpO2 100%   Breastfeeding Unknown   General: NAD CV: RRR Pulm: CTABL, nl effort ABD: s/nd/nt, fundus firm and below the umbilicus Lochia: moderate Perineum:minimal edema/repair well approximated DVT Evaluation: LE non-ttp, no evidence of DVT on exam.  Hemoglobin  Date Value Ref Range Status  02/26/2022 10.3 (L) 12.0 - 15.0 g/dL Final  HCT  Date Value Ref Range Status  02/26/2022 30.4 (L) 36.0 - 46.0 % Final    Risk assessment for postpartum VTE and prophylactic treatment: Very high risk factors: None High risk factors: None Moderate risk factors: BMI 30-40 kg/m2  Postpartum VTE prophylaxis with LMWH not indicated  Disposition: stable, discharge to  home. Baby Feeding: formula feeding Baby Disposition: home with mom  Rh Immune globulin indicated: No Rubella vaccine given: was not indicated Varivax vaccine given: was given Flu vaccine given in AP setting: declined  Tdap vaccine given in AP setting: Yes   Contraception:  Nexplanon  Prenatal Labs:  Blood type/Rh A pos  Antibody screen Negative    Rubella Immune    Varicella Equivocal   RPR NR    HBsAg NR   Hep C NR   HIV NR    GC neg  Chlamydia 02/09/2022 - POS, 02/22/2022 TOC NEG  Genetic screening cfDNA negative/AFP neg  1 hour GTT 101  3 hour GTT N/A  GBS Pos      Plan:  [name] was discharged to home in good condition. Follow-up appointment with delivering provider in 6 weeks.  Discharge Medications: Allergies as of 02/27/2022   No Known Allergies      Medication List     TAKE these medications    acetaminophen 500 MG tablet Commonly known as: TYLENOL Take 2 tablets (1,000 mg total) by mouth every 6 (six) hours.   albuterol 108 (90 Base) MCG/ACT inhaler Commonly known as: VENTOLIN HFA Inhale 2 puffs into the lungs every 6 (six) hours as needed for wheezing or shortness of breath.   benzocaine-Menthol 20-0.5 % Aero Commonly known as: DERMOPLAST Apply 1 Application topically as needed for irritation (perineal discomfort).   diphenhydrAMINE 25 mg capsule Commonly known as: BENADRYL Take 1 capsule (25 mg total) by mouth every 6 (six) hours as needed for itching.   ferrous sulfate 325 (65 FE) MG tablet Take 1 tablet (325 mg total) by mouth 2 (two) times daily with a meal.   ibuprofen 600 MG tablet Commonly known as: ADVIL Take 1 tablet (600 mg total) by mouth every 6 (six) hours.   prenatal multivitamin Tabs tablet Take 1 tablet by mouth daily at 12 noon.   senna-docusate 8.6-50 MG tablet Commonly known as: Senokot-S Take 2 tablets by mouth daily. Start taking on: February 28, 2022   simethicone 80 MG chewable tablet Commonly known as:  MYLICON Chew 1 tablet (80 mg total) by mouth as needed for flatulence.   witch hazel-glycerin pad Commonly known as: TUCKS Apply 1 Application topically as needed for hemorrhoids.         Follow-up Information     Minda Meo, CNM. Schedule an appointment as soon as possible for a visit in 6 week(s).   Specialty: Certified Nurse Midwife Why: postpartum visit and nexplanon insertion Contact information: [place] [address] [phone]         [facility] Follow up in 2 week(s).   Why: postpartum mood check Contact information: [place] [phone]                Signed: Francetta Found, CNM 02/27/2022 10:17 AM

## 2022-02-26 NOTE — Progress Notes (Signed)
Pt bending arm at this time.

## 2022-02-27 MED ORDER — SENNOSIDES-DOCUSATE SODIUM 8.6-50 MG PO TABS: 2.0000 | ORAL_TABLET | Freq: Every day | ORAL | 0 refills | Status: AC

## 2022-02-27 MED ORDER — DIPHENHYDRAMINE HCL 25 MG PO CAPS: 25.0000 mg | ORAL_CAPSULE | Freq: Four times a day (QID) | ORAL | 0 refills | Status: AC | PRN

## 2022-02-27 MED ORDER — BENZOCAINE-MENTHOL 20-0.5 % EX AERO: 1.0000 | INHALATION_SPRAY | CUTANEOUS | Status: AC | PRN

## 2022-02-27 MED ORDER — WITCH HAZEL-GLYCERIN EX PADS: 1.0000 | MEDICATED_PAD | CUTANEOUS | 12 refills | Status: AC | PRN

## 2022-02-27 MED ORDER — IBUPROFEN 600 MG PO TABS
600.0000 mg | ORAL_TABLET | Freq: Four times a day (QID) | ORAL | Status: DC
  Administered 2022-02-27: 600 mg via ORAL
  Filled 2022-02-27: qty 1

## 2022-02-27 MED ORDER — IBUPROFEN 600 MG PO TABS: 600.0000 mg | ORAL_TABLET | Freq: Four times a day (QID) | ORAL | 0 refills | Status: AC

## 2022-02-27 MED ORDER — SIMETHICONE 80 MG PO CHEW: 80.0000 mg | CHEWABLE_TABLET | ORAL | 0 refills | Status: AC | PRN

## 2022-02-27 MED ORDER — ACETAMINOPHEN 500 MG PO TABS
1000.0000 mg | ORAL_TABLET | Freq: Four times a day (QID) | ORAL | Status: DC
  Administered 2022-02-27: 1000 mg via ORAL
  Filled 2022-02-27: qty 2

## 2022-02-27 MED ORDER — ACETAMINOPHEN 500 MG PO TABS: 1000.0000 mg | ORAL_TABLET | Freq: Four times a day (QID) | ORAL | 0 refills | Status: AC

## 2022-02-27 MED ORDER — FERROUS SULFATE 325 (65 FE) MG PO TABS: 325.0000 mg | ORAL_TABLET | Freq: Two times a day (BID) | ORAL | 0 refills | Status: AC

## 2022-02-27 NOTE — Progress Notes (Signed)
Patient discharged. Discharge instructions given. Patient verbalizes understanding. Transported by staff. 

## 2022-02-27 NOTE — Progress Notes (Signed)
Post Partum Day 1 Subjective: Doing well, no complaints.  Tolerating regular diet, pain with PO meds, voiding and ambulating without difficulty.  No CP SOB Fever,Chills, N/V or leg pain; denies nipple or breast pain no HA change of vision, RUQ/epigastric pain  Objective: BP 120/74 (BP Location: Right Arm)   Pulse (!) 53   Temp 97.7 F (36.5 C) (Oral)   Resp 18   LMP 05/26/2021 (Exact Date)   SpO2 100%   Breastfeeding Unknown    Physical Exam:  General: NAD Breasts: soft/nontender CV: RRR Pulm: nl effort, CTABL Abdomen: soft, NT, BS x 4 Perineum: minimal edema, lacerations repair well approximated Lochia: moderate Uterine Fundus: fundus firm and 2 fb below umbilicus DVT Evaluation: no cords, ttp LEs   Recent Labs    02/25/22 2010 02/26/22 0640  HGB 11.0* 10.3*  HCT 31.8* 30.4*  WBC 15.8* 17.7*  PLT 323 331    Assessment/Plan: 22 y.o. G1P1001 postpartum day # 1  - Continue routine PP care - encouraged snug fitting bra and cabbage leaves for bottlefeeding.  - planning Nexplanon for contraception.  - Acute blood loss anemia - hemodynamically stable and asymptomatic; start po ferrous sulfate BID with stool softeners  - Immunization status: Needs varicella prior to DC    Disposition: Does  desire Dc home today.     Francetta Found, CNM 02/27/2022  9:45 AM

## 2022-03-01 NOTE — Anesthesia Postprocedure Evaluation (Signed)
Anesthesia Post Note  Patient: Courtney Lang  Procedure(s) Performed: AN AD [place]  Patient location during evaluation: Mother Baby Anesthesia Type: Epidural Level of consciousness: awake and alert Pain management: pain level controlled Vital Signs Assessment: post-procedure vital signs reviewed and stable Respiratory status: spontaneous breathing, nonlabored ventilation and respiratory function stable Cardiovascular status: stable Postop Assessment: no headache, no backache, patient able to bend at knees and able to ambulate Anesthetic complications: no   No notable events documented.   Last Vitals: There were no vitals filed for this visit.  Last Pain: There were no vitals filed for this visit.               Darrin Nipper

## 2022-04-28 ENCOUNTER — Telehealth: Payer: Self-pay

## 2022-04-28 NOTE — Telephone Encounter (Signed)
[facility]- Discharge Call Back: Left a VM for the pt about the following below. 1-Do you have any questions or concerns about yourself as you heal? 2-Any concerns or questions about your baby? 3-How was your stay at the hospital? 4-How did our team work together to care for you? You should be receiving a survey in the mail soon.   We would really appreciate it if you could fill that out for [place] and return it in the mail.  We value the feedback to make improvements and continue the great work we do.   If you have any questions please feel free to call me back at [phone]

## 2022-04-28 NOTE — Telephone Encounter (Signed)
[facility]- Discharge Call Backs-left pt a VM about the following below. 1-Do you have any questions or concerns about yourself as you heal? 2-Any concerns or questions about your baby? 3-Where does your baby sleep in your home? Review ABC's of safe sleep. 4-How was your stay at the hospital? 5-How did our team work together to care for you? You should be receiving a survey in the mail soon.   We would really appreciate it if you could fill that out for [place] and return it in the mail.  We value the feedback to make improvements and continue the great work we do.   If you have any questions please feel free to call me back at [phone]

## 2023-06-16 ENCOUNTER — Other Ambulatory Visit: Payer: Self-pay

## 2023-06-16 DIAGNOSIS — N6310 Unspecified lump in the right breast, unspecified quadrant: Secondary | ICD-10-CM

## 2023-06-24 ENCOUNTER — Other Ambulatory Visit

## 2023-06-29 ENCOUNTER — Ambulatory Visit
Admission: RE | Admit: 2023-06-29 | Discharge: 2023-06-29 | Disposition: A | Discharge status: Home / Self Care | Source: Ambulatory Visit

## 2023-06-29 DIAGNOSIS — N6310 Unspecified lump in the right breast, unspecified quadrant: Secondary | ICD-10-CM | POA: Diagnosis present

## 2023-06-29 DIAGNOSIS — N6320 Unspecified lump in the left breast, unspecified quadrant: Secondary | ICD-10-CM | POA: Insufficient documentation

## 2023-07-01 ENCOUNTER — Other Ambulatory Visit: Payer: Self-pay | Admitting: Certified Nurse Midwife

## 2023-07-01 DIAGNOSIS — R928 Other abnormal and inconclusive findings on diagnostic imaging of breast: Secondary | ICD-10-CM

## 2023-07-12 ENCOUNTER — Ambulatory Visit
Admission: RE | Admit: 2023-07-12 | Discharge: 2023-07-12 | Disposition: A | Discharge status: Home / Self Care | Source: Ambulatory Visit | Attending: Certified Nurse Midwife | Admitting: Certified Nurse Midwife

## 2023-07-12 DIAGNOSIS — R928 Other abnormal and inconclusive findings on diagnostic imaging of breast: Secondary | ICD-10-CM

## 2023-07-12 DIAGNOSIS — D242 Benign neoplasm of left breast: Secondary | ICD-10-CM | POA: Insufficient documentation

## 2023-07-12 DIAGNOSIS — D241 Benign neoplasm of right breast: Secondary | ICD-10-CM | POA: Insufficient documentation

## 2023-07-12 HISTORY — PX: BREAST BIOPSY: SHX20

## 2023-07-12 MED ORDER — LIDOCAINE 1 % OPTIME INJ - NO CHARGE
2.0000 mL | Freq: Once | INTRAMUSCULAR | Status: AC
  Administered 2023-07-12: 2 mL
  Filled 2023-07-12: qty 2

## 2023-07-12 MED ORDER — LIDOCAINE 1 % OPTIME INJ - NO CHARGE: 2.0000 mL | Freq: Once | INTRAMUSCULAR | Status: DC

## 2023-07-12 MED ORDER — LIDOCAINE-EPINEPHRINE 1 %-1:100000 IJ SOLN
8.0000 mL | Freq: Once | INTRAMUSCULAR | Status: AC
  Administered 2023-07-12: 8 mL
  Filled 2023-07-12: qty 8

## 2023-07-12 MED ORDER — LIDOCAINE-EPINEPHRINE 1 %-1:100000 IJ SOLN: 8.0000 mL | Freq: Once | INTRAMUSCULAR | Status: DC

## 2023-07-13 LAB — SURGICAL PATHOLOGY
